# Patient Record
Sex: Male | Born: 1955 | Race: White | Hispanic: No | Marital: Single | Smoking: Current every day smoker
Health system: Southern US, Community
[De-identification: ages and names within clinical notes are randomized; demographics above are authoritative.]

---

## 2015-11-27 ENCOUNTER — Emergency Department (HOSPITAL_COMMUNITY): Payer: Medicaid - Out of State

## 2015-11-27 ENCOUNTER — Encounter (HOSPITAL_COMMUNITY): Payer: Self-pay

## 2015-11-27 ENCOUNTER — Emergency Department (HOSPITAL_COMMUNITY)
Admission: EM | Admit: 2015-11-27 | Discharge: 2015-11-27 | Disposition: A | Discharge status: Home / Self Care | Payer: Medicaid - Out of State | Attending: Emergency Medicine | Admitting: Emergency Medicine

## 2015-11-27 DIAGNOSIS — F10230 Alcohol dependence with withdrawal, uncomplicated: Secondary | ICD-10-CM

## 2015-11-27 DIAGNOSIS — R569 Unspecified convulsions: Secondary | ICD-10-CM | POA: Diagnosis present

## 2015-11-27 DIAGNOSIS — F17200 Nicotine dependence, unspecified, uncomplicated: Secondary | ICD-10-CM | POA: Insufficient documentation

## 2015-11-27 DIAGNOSIS — F10231 Alcohol dependence with withdrawal delirium: Secondary | ICD-10-CM | POA: Diagnosis not present

## 2015-11-27 DIAGNOSIS — F10930 Alcohol use, unspecified with withdrawal, uncomplicated: Secondary | ICD-10-CM

## 2015-11-27 DIAGNOSIS — Z5181 Encounter for therapeutic drug level monitoring: Secondary | ICD-10-CM | POA: Diagnosis not present

## 2015-11-27 LAB — CBC WITH DIFFERENTIAL/PLATELET
BASOS ABS: 0.1 10*3/uL (ref 0.0–0.1)
Basophils Relative: 1 %
Eosinophils Absolute: 0.1 10*3/uL (ref 0.0–0.7)
Eosinophils Relative: 1 %
HCT: 32.6 % — ABNORMAL LOW (ref 39.0–52.0)
HEMOGLOBIN: 11.9 g/dL — AB (ref 13.0–17.0)
LYMPHS ABS: 0.7 10*3/uL (ref 0.7–4.0)
LYMPHS PCT: 14 %
MCH: 36 pg — AB (ref 26.0–34.0)
MCHC: 36.5 g/dL — ABNORMAL HIGH (ref 30.0–36.0)
MCV: 98.5 fL (ref 78.0–100.0)
Monocytes Absolute: 0.7 10*3/uL (ref 0.1–1.0)
Monocytes Relative: 14 %
NEUTROS PCT: 70 %
Neutro Abs: 3.3 10*3/uL (ref 1.7–7.7)
Platelets: 112 10*3/uL — ABNORMAL LOW (ref 150–400)
RBC: 3.31 MIL/uL — AB (ref 4.22–5.81)
RDW: 13.7 % (ref 11.5–15.5)
WBC: 4.8 10*3/uL (ref 4.0–10.5)

## 2015-11-27 LAB — URINALYSIS, ROUTINE W REFLEX MICROSCOPIC
BILIRUBIN URINE: NEGATIVE
GLUCOSE, UA: NEGATIVE mg/dL
Ketones, ur: NEGATIVE mg/dL
Leukocytes, UA: NEGATIVE
NITRITE: NEGATIVE
Protein, ur: 30 mg/dL — AB
pH: 5.5 (ref 5.0–8.0)

## 2015-11-27 LAB — COMPREHENSIVE METABOLIC PANEL
ALBUMIN: 4.7 g/dL (ref 3.5–5.0)
ALK PHOS: 54 U/L (ref 38–126)
ALT: 109 U/L — AB (ref 17–63)
AST: 175 U/L — AB (ref 15–41)
Anion gap: 8 (ref 5–15)
BILIRUBIN TOTAL: 0.8 mg/dL (ref 0.3–1.2)
BUN: 8 mg/dL (ref 6–20)
CALCIUM: 8.6 mg/dL — AB (ref 8.9–10.3)
CO2: 25 mmol/L (ref 22–32)
CREATININE: 0.48 mg/dL — AB (ref 0.61–1.24)
Chloride: 98 mmol/L — ABNORMAL LOW (ref 101–111)
GFR calc Af Amer: >60 mL/min (ref >60)
GFR calc non Af Amer: >60 mL/min (ref >60)
GLUCOSE: 105 mg/dL — AB (ref 65–99)
Potassium: 3.5 mmol/L (ref 3.5–5.1)
Sodium: 131 mmol/L — ABNORMAL LOW (ref 135–145)
TOTAL PROTEIN: 7.4 g/dL (ref 6.5–8.1)

## 2015-11-27 LAB — RAPID URINE DRUG SCREEN, HOSP PERFORMED
Amphetamines: NOT DETECTED
BARBITURATES: NOT DETECTED
Benzodiazepines: NOT DETECTED
Cocaine: NOT DETECTED
Opiates: NOT DETECTED
Tetrahydrocannabinol: NOT DETECTED

## 2015-11-27 LAB — URINE MICROSCOPIC-ADD ON: Squamous Epithelial / LPF: NONE SEEN

## 2015-11-27 LAB — ETHANOL

## 2015-11-27 LAB — PROTIME-INR
INR: 0.89 (ref 0.00–1.49)
Prothrombin Time: 12.2 seconds (ref 11.6–15.2)

## 2015-11-27 LAB — CBG MONITORING, ED: Glucose-Capillary: 105 mg/dL — ABNORMAL HIGH (ref 65–99)

## 2015-11-27 MED ORDER — CHLORDIAZEPOXIDE HCL 25 MG PO CAPS
50.0000 mg | ORAL_CAPSULE | Freq: Once | ORAL | Status: AC
  Administered 2015-11-27: 50 mg via ORAL

## 2015-11-27 MED ORDER — CHLORDIAZEPOXIDE HCL 25 MG PO CAPS: ORAL_CAPSULE | ORAL | Status: AC

## 2015-11-27 MED ORDER — LORAZEPAM 2 MG/ML IJ SOLN
1.0000 mg | Freq: Once | INTRAMUSCULAR | Status: AC
  Administered 2015-11-27: 1 mg via INTRAVENOUS
  Filled 2015-11-27: qty 1

## 2015-11-27 MED ORDER — CHLORDIAZEPOXIDE HCL 25 MG PO CAPS
ORAL_CAPSULE | ORAL | Status: AC
  Filled 2015-11-27: qty 2

## 2015-11-27 NOTE — ED Notes (Signed)
Per EMS, called out for possible seizure. States patient was confused on their arrival. Pt alert and oriented on arrival to ED

## 2015-11-27 NOTE — Discharge Instructions (Signed)
Alcohol Withdrawal  When a person who drinks a lot of alcohol stops drinking, he or she may go through alcohol withdrawal. Alcohol withdrawal causes problems. It can make you feel:  · Tired (fatigued).  · Sad (depressed).  · Fearful (anxious).  · Grouchy (irritable).  · Not hungry.  · Sick to your stomach (nauseous).  · Shaky.  It can also make you have:  · Nightmares.  · Trouble sleeping.  · Trouble thinking clearly.  · Mood swings.  · Clammy skin.  · Very bad sweating.  · A very fast heartbeat.  · Shaking that you cannot control (tremor).  · Having a fever.  · A fit of movements that you cannot control (seizure).  · Confusion.  · Throwing up (vomiting).  · Feeling or seeing things that are not there (hallucinations).  HOME CARE  · Take medicines and vitamins only as told by your doctor.  · Do not drink alcohol.  · Have someone around in case you need help.  · Drink enough fluid to keep your pee (urine) clear or pale yellow.  · Think about joining a group to help you stop drinking.  GET HELP IF:  · Your problems get worse.  · Your problems do not go away.  · You cannot eat or drink without throwing up.  · You are having a hard time not drinking alcohol.  · You cannot stop drinking alcohol.  GET HELP RIGHT AWAY IF:  · You feel your heart beating differently than usual.  · Your chest hurts.  · You have trouble breathing.  · You have very bad problems, like:    A fever.    A fit of movements that you cannot control.    Being very confused.    Feeling or seeing things that are not there.     This information is not intended to replace advice given to you by your health care provider. Make sure you discuss any questions you have with your health care provider.     Document Released: 10/18/2007 Document Revised: 05/22/2014 Document Reviewed: 02/17/2014  Elsevier Interactive Patient Education ©2016 [employer]

## 2015-11-27 NOTE — ED Notes (Signed)
Reminded patient that urine specimen still needed 

## 2015-11-27 NOTE — ED Provider Notes (Signed)
CSN: [id number]     Arrival date & time 11/27/15  1841 History   First MD Initiated Contact with Patient 11/27/15 1843     Chief Complaint  Patient presents with  . Seizures   Pt is a 60 yo wm who had a seizure pta.  The pt has a hx of alcohol abuse and has not drank any alcohol in a few days.  Pt said that he has also been outside in the heat.  The pt said that he has no idea what happened and is unable to describe what happened. He has never had a seizure in the past.  EMS said that he was confused upon their arrival.  Pt does have a hx of htn, but does not take any meds for it.   (Consider location/radiation/quality/duration/timing/severity/associated sxs/prior Treatment) Patient is a 60 y.o. male presenting with seizures. The history is provided by the patient.  Seizures Seizure activity on arrival: no   Return to baseline: yes     History reviewed. No pertinent past medical history. History reviewed. No pertinent past surgical history. No family history on file. Social History  Substance Use Topics  . Smoking status: Current Every Day Smoker  . Smokeless tobacco: None  . Alcohol Use: Yes     Comment: heavy drinker    Review of Systems  Neurological: Positive for seizures.  All other systems reviewed and are negative.     Allergies  Review of patient's allergies indicates no known allergies.  Home Medications   Prior to Admission medications   Medication Sig Start Date End Date Taking? Authorizing Provider  chlordiazePOXIDE (LIBRIUM) 25 MG capsule  PO TID x 1D, then 25-50mg  PO BID X 1D, then 25-50mg  PO QD X 1D 11/27/15   Jacalyn Lefevre, MD   BP 144/77 mmHg  Pulse 76  Temp(Src) 98.5 F (36.9 C) (Oral)  Resp 13  Ht  (1.727 m)  Wt 130 lb (58.968 kg)  BMI 19.77 kg/m2  SpO2 100% Physical Exam  Constitutional: He is oriented to person, place, and time. He appears well-developed and well-nourished.  HENT:  Head: Normocephalic and atraumatic.  Right Ear:  External ear normal.  Left Ear: External ear normal.  Nose: Nose normal.  Mouth/Throat:    Eyes: Conjunctivae and EOM are normal. Pupils are equal, round, and reactive to light.  Neck: Normal range of motion. Neck supple.  Cardiovascular: Normal rate, regular rhythm, normal heart sounds and intact distal pulses.   Pulmonary/Chest: Effort normal and breath sounds normal.  Abdominal: Soft. Bowel sounds are normal.  Musculoskeletal: Normal range of motion.  Neurological: He is alert and oriented to person, place, and time.  Skin: Skin is warm and dry.  Psychiatric: He has a normal mood and affect. His behavior is normal. Judgment and thought content normal.  Nursing note and vitals reviewed.   ED Course  Procedures (including critical care time) Labs Review Labs Reviewed  COMPREHENSIVE METABOLIC PANEL - Abnormal; Notable for the following:    Sodium 131 (*)    Chloride 98 (*)    Glucose, Bld 105 (*)    Creatinine, Ser 0.48 (*)    Calcium 8.6 (*)    AST 175 (*)    ALT 109 (*)    All other components within normal limits  CBC WITH DIFFERENTIAL/PLATELET - Abnormal; Notable for the following:    RBC 3.31 (*)    Hemoglobin 11.9 (*)    HCT 32.6 (*)    MCH 36.0 (*)  MCHC 36.5 (*)    Platelets 112 (*)    All other components within normal limits  URINALYSIS, ROUTINE W REFLEX MICROSCOPIC (NOT AT [facility]) - Abnormal; Notable for the following:    Specific Gravity, Urine >1.030 (*)    Hgb urine dipstick SMALL (*)    Protein, ur 30 (*)    All other components within normal limits  URINE MICROSCOPIC-ADD ON - Abnormal; Notable for the following:    Bacteria, UA RARE (*)    All other components within normal limits  CBG MONITORING, ED - Abnormal; Notable for the following:    Glucose-Capillary 105 (*)    All other components within normal limits  URINE RAPID DRUG SCREEN, HOSP PERFORMED  ETHANOL  PROTIME-INR    Imaging Review Dg Chest 2 View  11/27/2015  CLINICAL DATA:   60 year old male with seizures. EXAM: CHEST  2 VIEW COMPARISON:  None. FINDINGS: There is a 7 mm left upper lobe nodule superimposed over the posterior fourth rib. The lungs are otherwise clear. There is no pleural effusion or pneumothorax. The cardiac silhouette is within normal limits with no acute osseous pathology and upper IMPRESSION: No acute cardiopulmonary process. A 7 mm nodule in the left upper lobe. Direct comparison with prior imaging studies if available recommended is to document stability. If no prior images are available follow-up with CT recommended. Electronically Signed   By: Elgie Collard M.D.   On: 11/27/2015 19:49   Ct Head Wo Contrast  11/27/2015  CLINICAL DATA:  Altered mental status on arrival of EMS. Dizziness/confusion Patient states symptoms subsided currently, no h/x of seizure per patient. EXAM: CT HEAD WITHOUT CONTRAST TECHNIQUE: Contiguous axial images were obtained from the base of the skull through the vertex without intravenous contrast. COMPARISON:  None. FINDINGS: Mild periventricular white matter changes are consistent with small vessel disease. There is no intra or extra-axial fluid collection or mass lesion. The basilar cisterns and ventricles have a normal appearance. There is no CT evidence for acute infarction or hemorrhage. There is atherosclerotic calcification of the femoral carotid arteries. Bone windows are otherwise unremarkable. IMPRESSION: Mild changes of small vessel disease. No evidence for acute intracranial abnormality. Electronically Signed   By: Norva Pavlov M.D.   On: 11/27/2015 19:58   I have personally reviewed and evaluated these images and lab results as part of my medical decision-making.   EKG Interpretation   Date/Time:  Saturday November 27 2015 18:53:33 EDT Ventricular Rate:  86 PR Interval:    QRS Duration: 100 QT Interval:  398 QTC Calculation: 476 R Axis:   69 Text Interpretation:  Sinus rhythm RSR' in V1 or V2, probably normal   variant Borderline prolonged QT interval Baseline wander in lead(s) I III  aVL Confirmed by Cliff Damiani MD, Raiyah Speakman ([id number]) on 11/27/2015 6:55:40 PM      MDM  Family is here now.  Pt is from [place] and they are all gathered here for a family reunion.  The sister is a Engineer, civil (consulting) and said his seizure lasted a few minutes.  She said he had the shakes really bad today.   Pt is now awake and alert.  He said that he will think about rehab when he gets back home.  The pt knows to return here if worse and to f/u with his pcp when he gets home.  Pt will be going back to his family's house and will be with his brother and sister who can watch him.  He will be  started on librium.   Final diagnoses:  Alcohol withdrawal seizure, uncomplicated (HCC)      Jacalyn LefevreJulie Jordana Dugue, MD 11/27/15 2114

## 2017-07-29 IMAGING — CT CT HEAD W/O CM
4 series · 17 of 47 positions shown, 19 images · non-contrast
Comparison: None.

CLINICAL DATA: Altered mental status on arrival of EMS.
Dizziness/confusion Patient states symptoms subsided currently, no
h/x of seizure per patient.

EXAM:
CT HEAD WITHOUT CONTRAST
TECHNIQUE: Contiguous axial images were obtained from the base of the skull
through the vertex without intravenous contrast.

[Series 2: head w/o · axial · non-contrast · 0.43mm/px · z∈[+133,+257]mm · 8 of 41 slices shown, 10 images]
[im 5/41  brain]
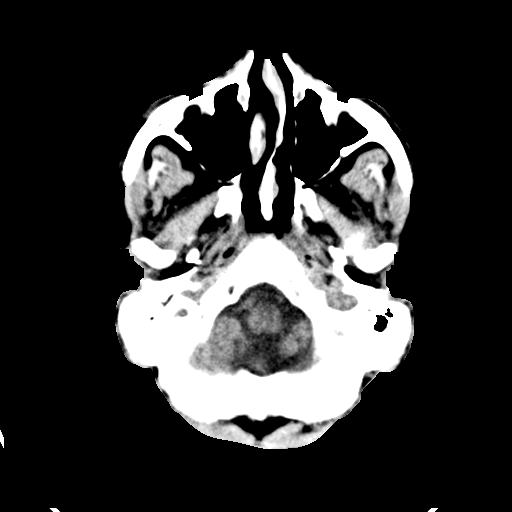
[im 5/41  bone]
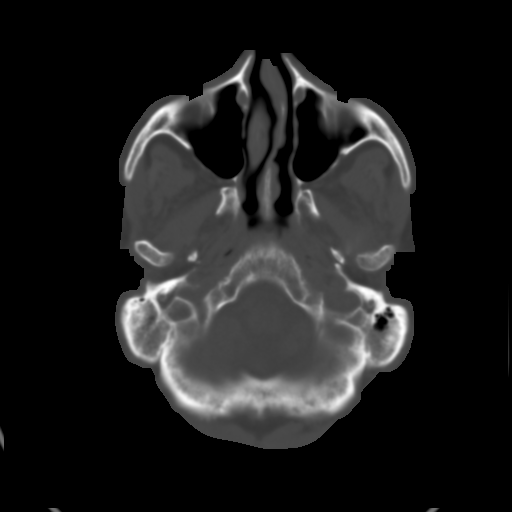
[im 9/41  brain]
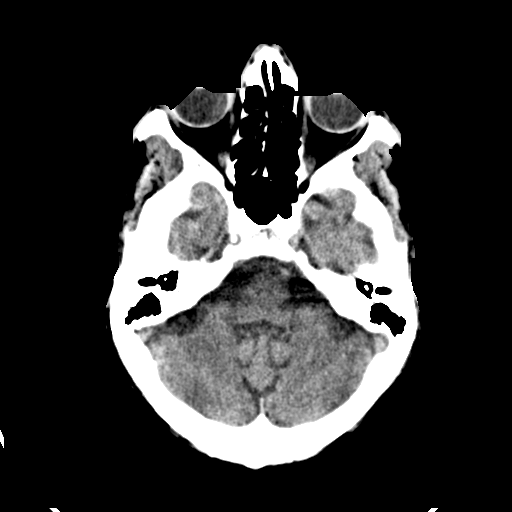
[im 14/41  brain]
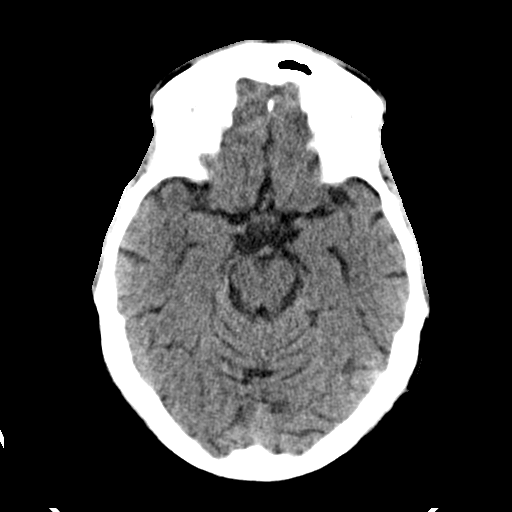
[im 18/41  brain]
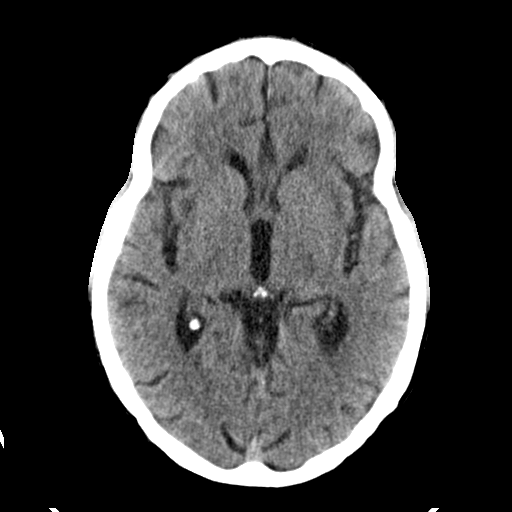
[im 23/41  brain]
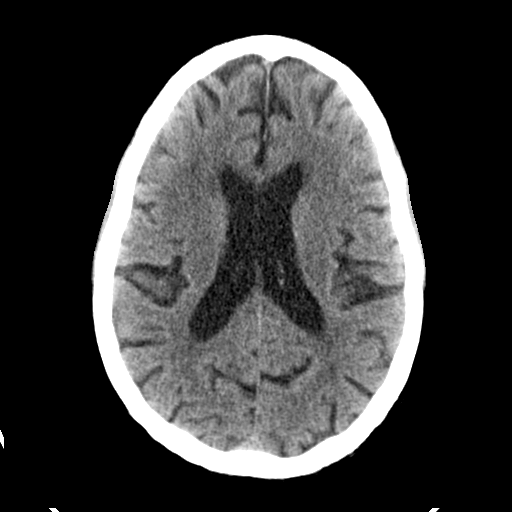
[im 23/41  bone]
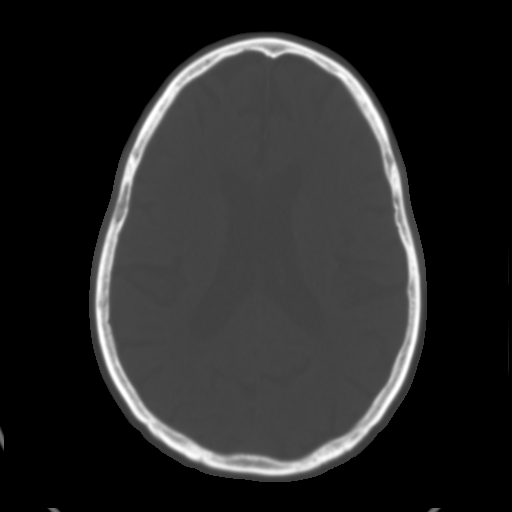
[im 27/41  brain]
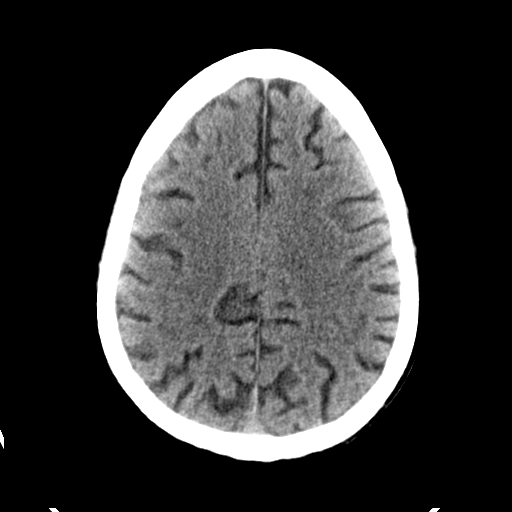
[im 32/41  brain]
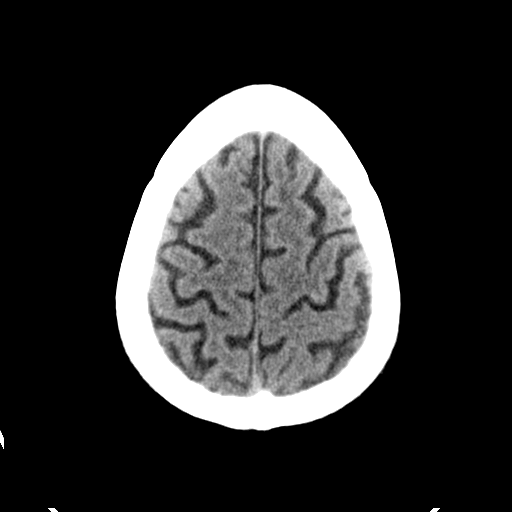
[im 36/41  brain]
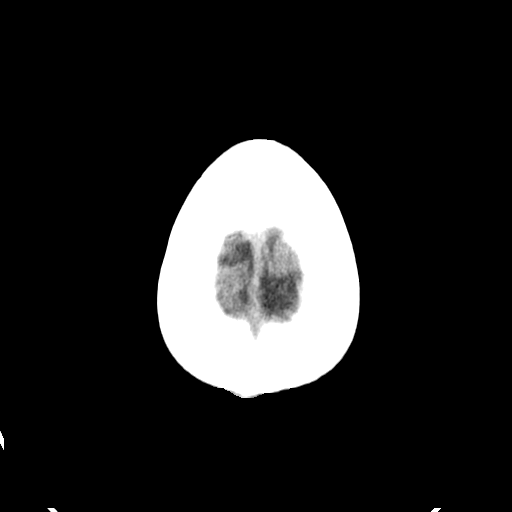

[Series 3: head bone · axial · 0.43mm/px · z∈[+133,+167]mm · 3 of 82 slices shown]
[im 9/82  bone]
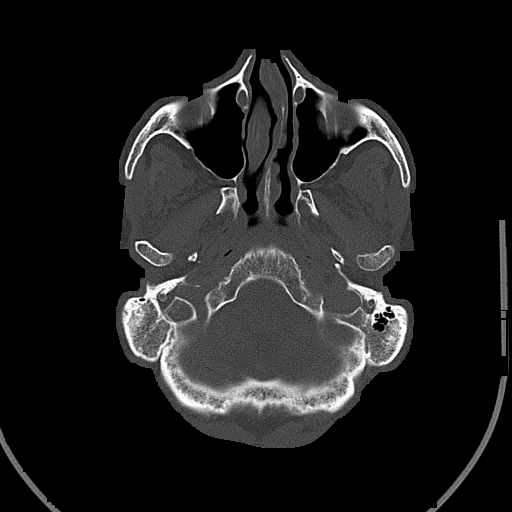
[im 18/82  bone]
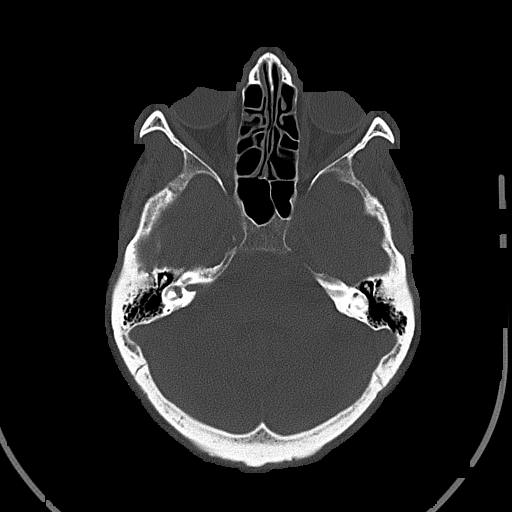
[im 26/82  bone]
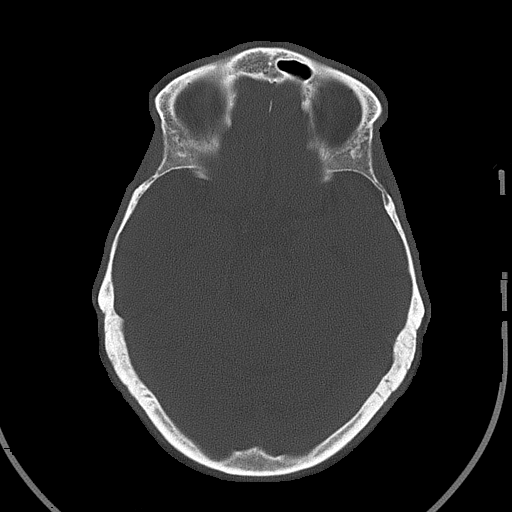

[Series 4: coronal · coronal · 0.33mm/px · 3 of 67 slices shown]
[im 23/67  brain]
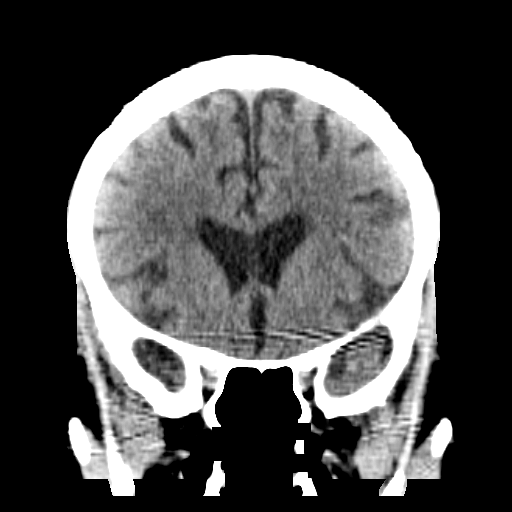
[im 30/67  brain]
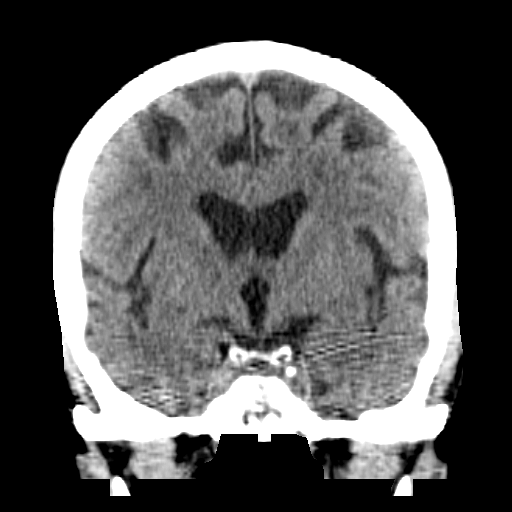
[im 37/67  brain]
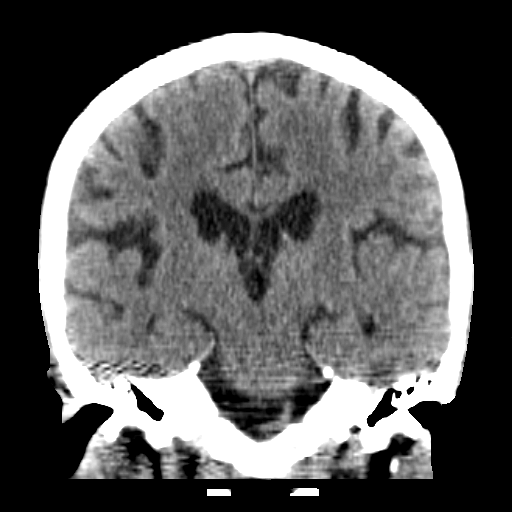

[Series 5: sagittal · sagittal · 0.35mm/px · 3 of 52 slices shown]
[im 18/52  brain]
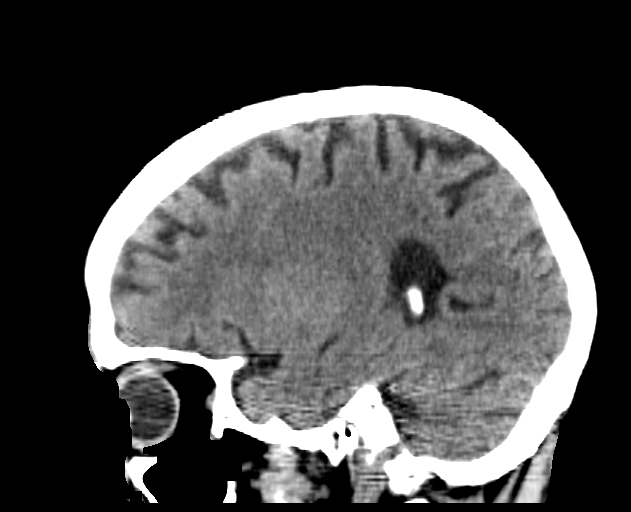
[im 26/52  brain]
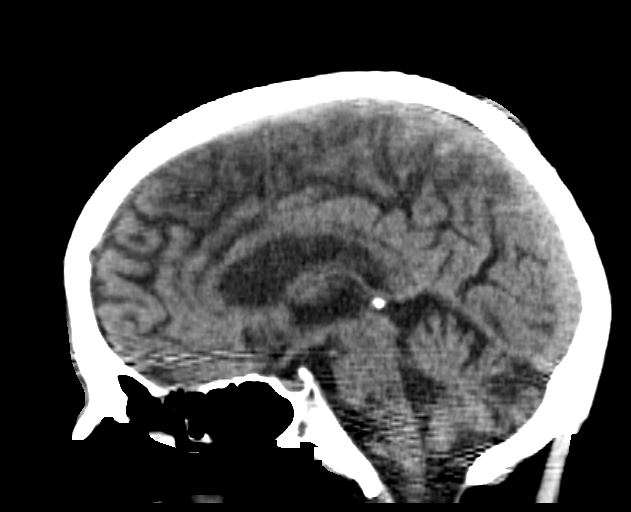
[im 35/52  brain]
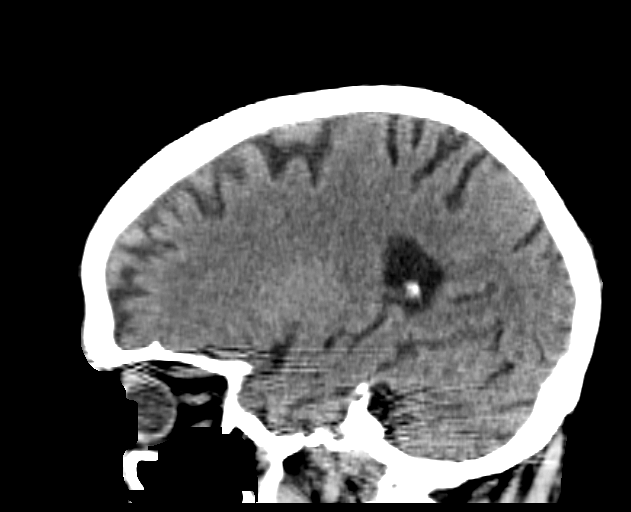

[17 of 47 positions shown; findings below may reference images not displayed]

FINDINGS: Mild periventricular white matter changes are consistent with small
vessel disease. There is no intra or extra-axial fluid collection or
mass lesion. The basilar cisterns and ventricles have a normal
appearance. There is no CT evidence for acute infarction or
hemorrhage. There is atherosclerotic calcification of the femoral
carotid arteries. Bone windows are otherwise unremarkable.
IMPRESSION: Mild changes of small vessel disease.

No evidence for acute intracranial abnormality.
